# Patient Record
Sex: Male | Born: 1980 | Race: Black or African American | Hispanic: No | Marital: Single
Health system: Southern US, Community
[De-identification: ages and names within clinical notes are randomized; demographics above are authoritative.]

---
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
## 2003-07-27 ENCOUNTER — Emergency Department (HOSPITAL_COMMUNITY): Admission: EM | Admit: 2003-07-27 | Discharge: 2003-07-28 | Payer: Self-pay | Admitting: Emergency Medicine

## 2010-10-12 ENCOUNTER — Emergency Department (HOSPITAL_COMMUNITY): Payer: Self-pay

## 2010-10-12 ENCOUNTER — Emergency Department (HOSPITAL_COMMUNITY)
Admission: EM | Admit: 2010-10-12 | Discharge: 2010-10-12 | Disposition: A | Discharge status: Home / Self Care | Payer: Self-pay | Attending: Emergency Medicine | Admitting: Emergency Medicine

## 2010-10-12 DIAGNOSIS — M5380 Other specified dorsopathies, site unspecified: Secondary | ICD-10-CM | POA: Insufficient documentation

## 2010-10-12 DIAGNOSIS — R51 Headache: Secondary | ICD-10-CM | POA: Insufficient documentation

## 2010-10-12 DIAGNOSIS — J329 Chronic sinusitis, unspecified: Secondary | ICD-10-CM | POA: Insufficient documentation

## 2010-10-12 DIAGNOSIS — M542 Cervicalgia: Secondary | ICD-10-CM | POA: Insufficient documentation

## 2012-02-13 IMAGING — CT CT HEAD W/O CM
3 series · 17 of 30 positions shown, 19 images · non-contrast
Comparison: None.
COMPARISON: none

CLINICAL DATA: Headache

CT HEAD WITHOUT CONTRAST
TECHNIQUE: Contiguous axial images were obtained from the base of
the skull through the vertex without intravenous contrast.
CLINICAL DATA: Headache, right-sided pain
CT LIMITED SINUSES WITHOUT CONTRAST
TECHNIQUE: Multidetector CT images of the paranasal sinuses were
obtained in a single plane without contrast.

[Series 2: brain · axial · 0.48mm/px · z∈[+126,+228]mm · 7 of 28 slices shown, 9 images]
[im 4/28  brain]
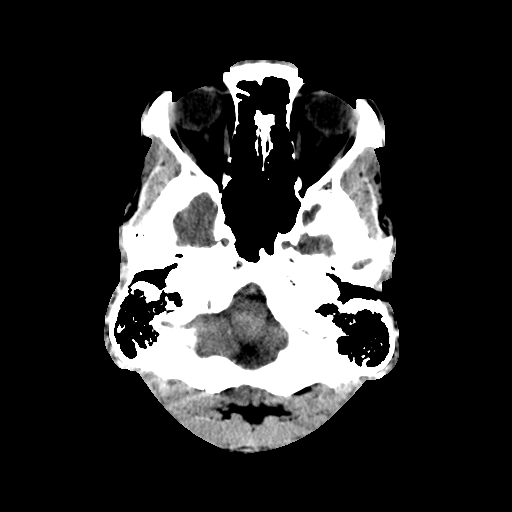
[im 4/28  bone]
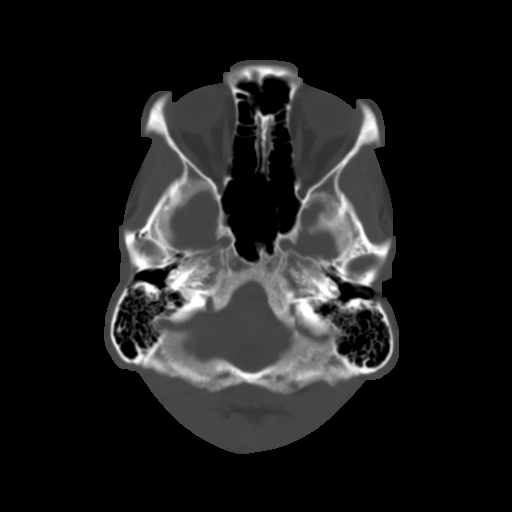
[im 7/28  brain]
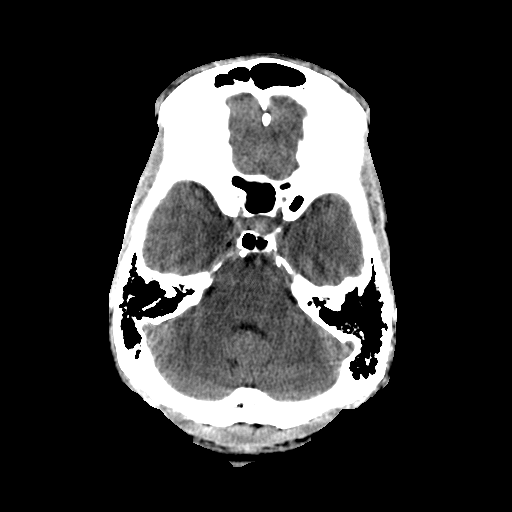
[im 11/28  brain]
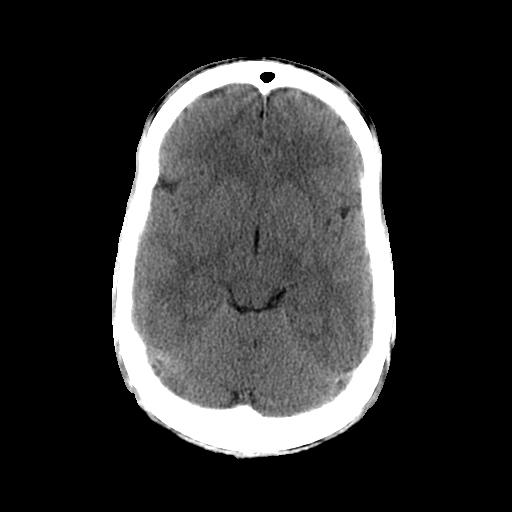
[im 14/28  brain]
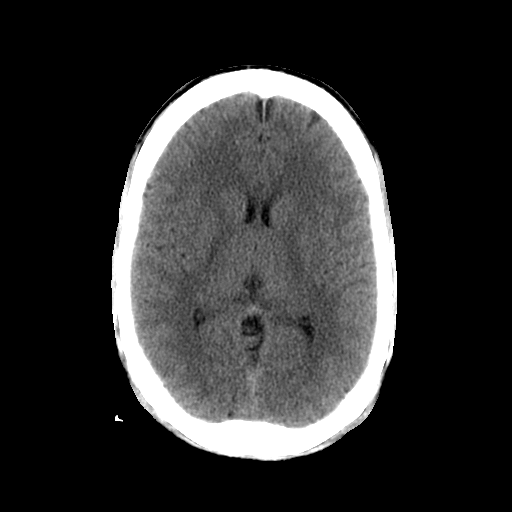
[im 17/28  brain]
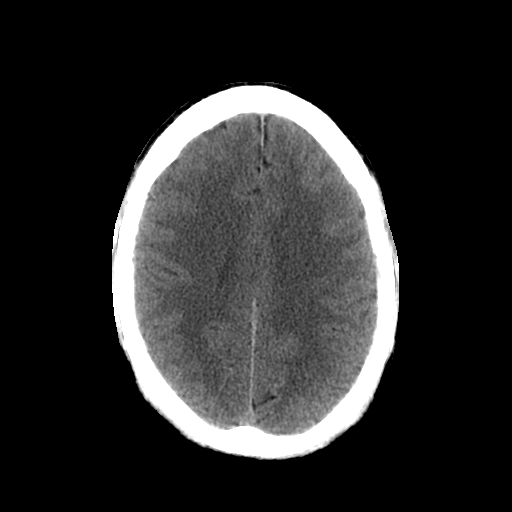
[im 17/28  bone]
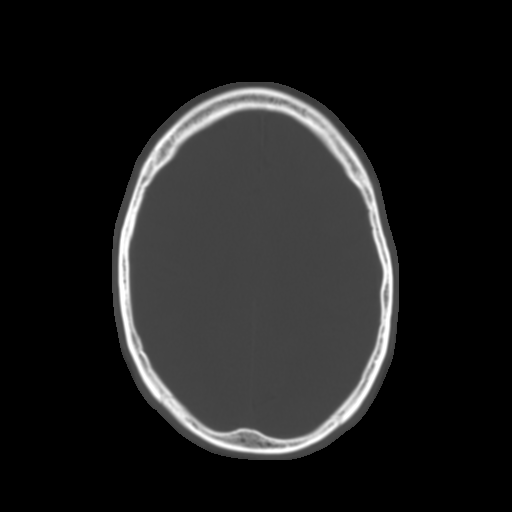
[im 21/28  brain]
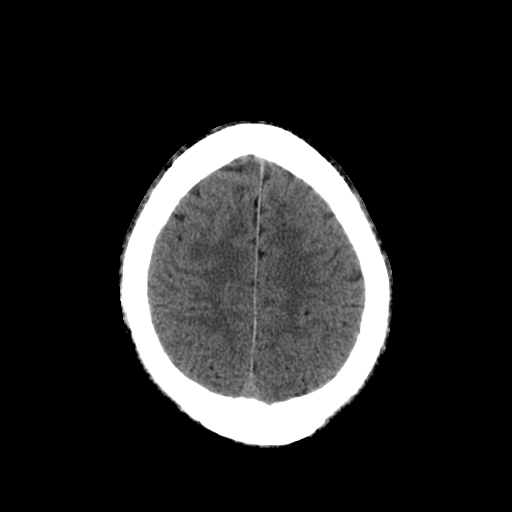
[im 24/28  brain]
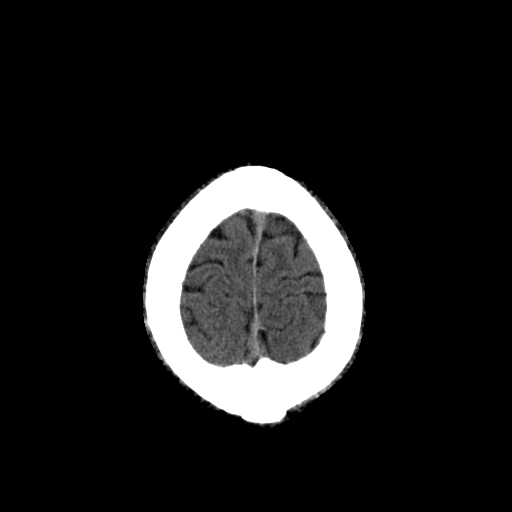

[Series 3: recon 2: brain · axial · 0.48mm/px · z∈[+126,+228]mm · 7 of 28 slices shown]
[im 4/28  brain]
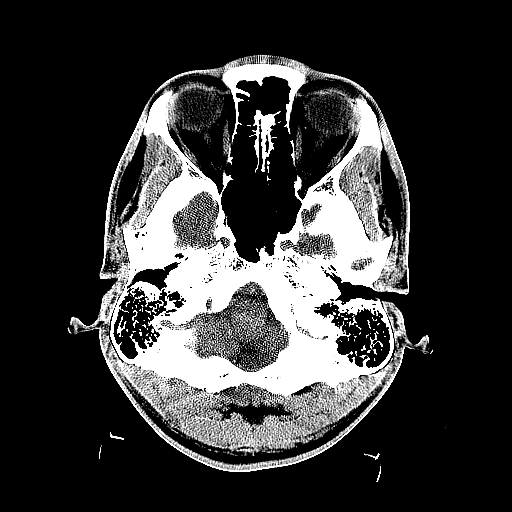
[im 7/28  brain]
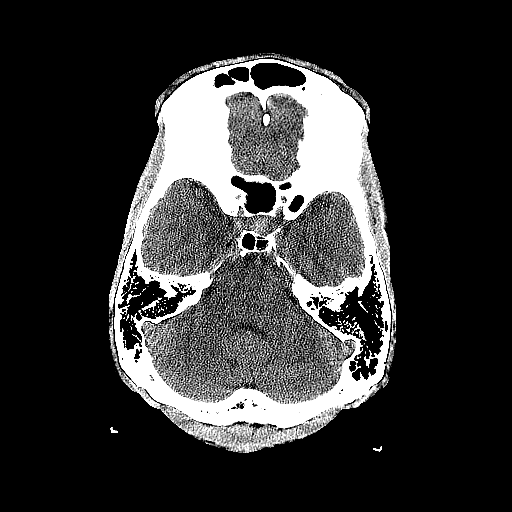
[im 11/28  brain]
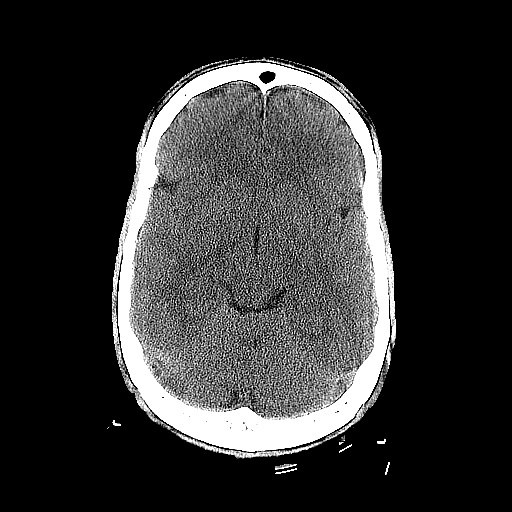
[im 14/28  brain]
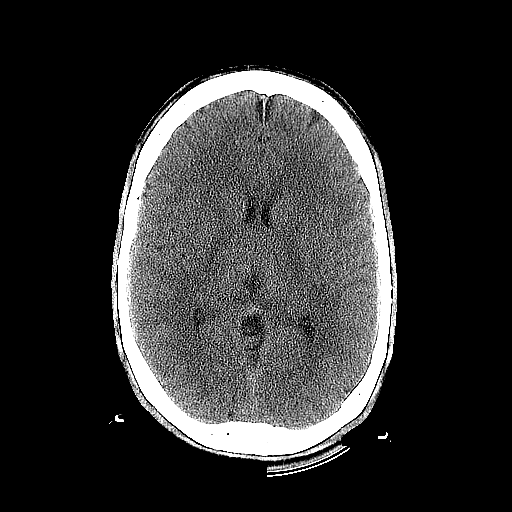
[im 17/28  brain]
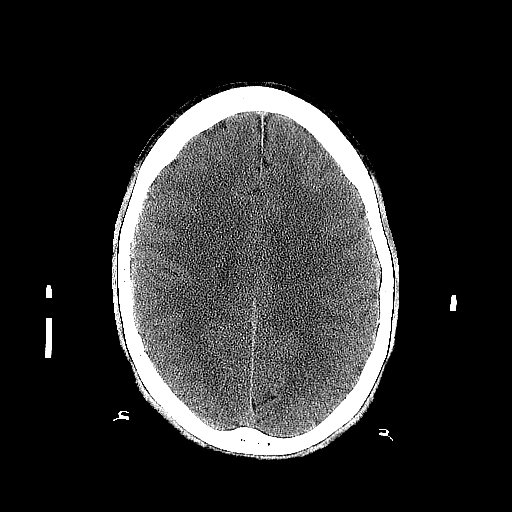
[im 21/28  brain]
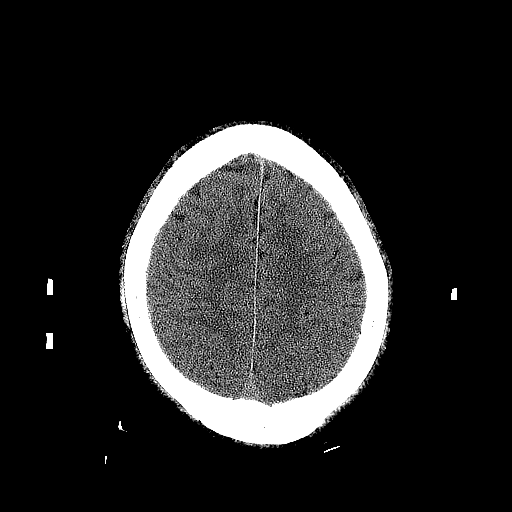
[im 24/28  brain]
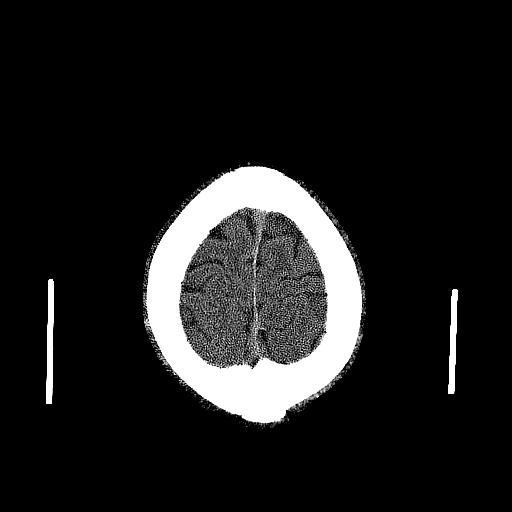

[Series 104: — · axial · 0.33mm/px · z∈[+65,+95]mm · 3 of 40 slices shown]
[im 4/40  brain]
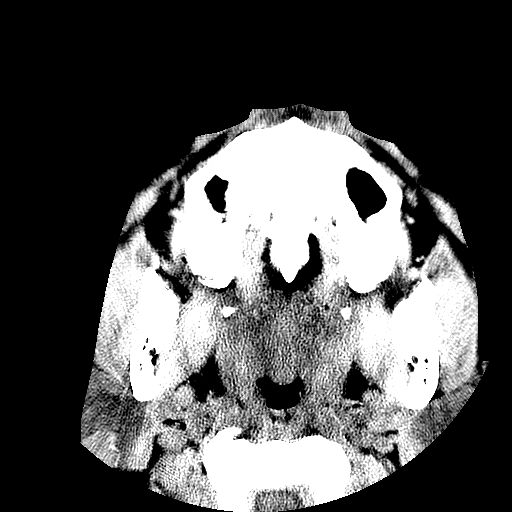
[im 8/40  brain]
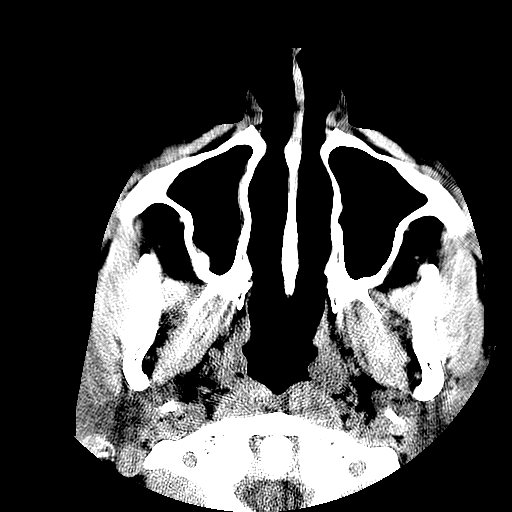
[im 15/40  brain]
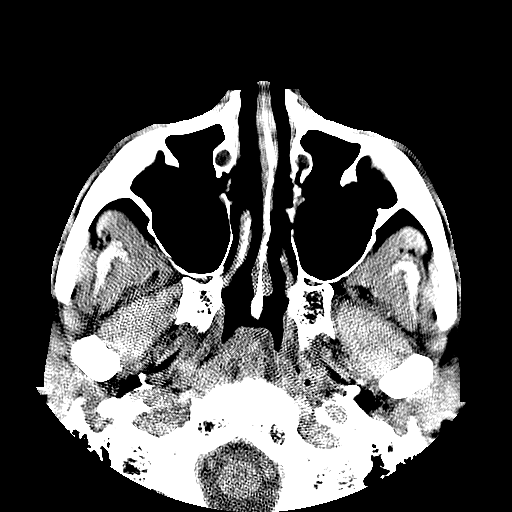

[17 of 30 positions shown; findings below may reference images not displayed]

FINDINGS: Ventricle size is normal.  Negative for infarct or mass.
Negative for hemorrhage.  Calvarium is intact.  Visualized sinuses
are clear.
IMPRESSION: Normal
FINDINGS: Minimal mucosal edema in the maxillary sinuses
bilaterally.  Remainder of the paranasal sinuses are clear.  No air-
fluid level.  No acute bony changes.
IMPRESSION: Mild mucosal edema and bilateral maxillary sinuses compatible with
chronic sinusitis.

## 2018-01-06 ENCOUNTER — Emergency Department (HOSPITAL_COMMUNITY)
Admission: EM | Admit: 2018-01-06 | Discharge: 2018-01-06 | Disposition: A | Discharge status: Home / Self Care | Payer: Self-pay | Attending: Emergency Medicine | Admitting: Emergency Medicine

## 2018-01-06 ENCOUNTER — Encounter (HOSPITAL_COMMUNITY): Payer: Self-pay | Admitting: Emergency Medicine

## 2018-01-06 ENCOUNTER — Other Ambulatory Visit: Payer: Self-pay

## 2018-01-06 DIAGNOSIS — K0889 Other specified disorders of teeth and supporting structures: Secondary | ICD-10-CM | POA: Insufficient documentation

## 2018-01-06 DIAGNOSIS — H6121 Impacted cerumen, right ear: Secondary | ICD-10-CM | POA: Insufficient documentation

## 2018-01-06 MED ORDER — NAPROXEN 500 MG PO TABS: 500.0000 mg | ORAL_TABLET | Freq: Two times a day (BID) | ORAL | 0 refills | Status: AC

## 2018-01-06 MED ORDER — PENICILLIN V POTASSIUM 500 MG PO TABS: 500.0000 mg | ORAL_TABLET | Freq: Four times a day (QID) | ORAL | 0 refills | Status: AC

## 2018-01-06 NOTE — ED Triage Notes (Signed)
Patient c/o right sided dental pain and right sided ear and facial  pain.

## 2018-01-06 NOTE — ED Notes (Signed)
Patient given discharge instructions and verbalized understanding.  Patient stable to discharge at this time.  Patient is alert and oriented to baseline.  No distressed noted at this time.  All belongings taken with the patient at discharge.   

## 2018-01-06 NOTE — ED Provider Notes (Signed)
[facility] EMERGENCY DEPARTMENT Provider Note   CSN: [id number] Arrival date & time: 01/06/18  4098     History   Chief Complaint Chief Complaint  Patient presents with  . Dental Pain    HPI [facility] Rodriguez is a 37 y.o. male.  HPI Patient presents to the emergency room for evaluation of right jaw pain that started about 30 days ago.  Patient states he has had some intermittent pain in his right upper jaw.  Patient has noted he has pain in his posterior molar and it hurts to chew.  More recently he started having pain in his ear as well.  Patient states the symptoms were increasing so he came to the emergency room to be evaluated.  He denies any fevers or chills.  No difficulty swallowing or breathing.  No sore throat.  No recent trauma. History reviewed. No pertinent past medical history.  There are no active problems to display for this patient.   History reviewed. No pertinent surgical history.      Home Medications    Prior to Admission medications   Medication Sig Start Date End Date Taking? Authorizing Provider  naproxen (NAPROSYN) 500 MG tablet Take 1 tablet (500 mg total) by mouth 2 (two) times daily with a meal. As needed for pain 01/06/18   Linwood Dibbles, MD  penicillin v potassium (VEETID) 500 MG tablet Take 1 tablet (500 mg total) by mouth 4 (four) times daily for 7 days. 01/06/18 01/13/18  Linwood Dibbles, MD    Family History No family history on file.  Social History Social History   Tobacco Use  . Smoking status: Not on file  Substance Use Topics  . Alcohol use: Not on file  . Drug use: Not on file     Allergies   Patient has no known allergies.   Review of Systems Review of Systems  All other systems reviewed and are negative.    Physical Exam Updated Vital Signs BP (!) 155/87 (BP Location: Right Arm)   Pulse 71   Temp 98.9 F (37.2 C) (Oral)   Resp 18   Ht 1.93 m (6\' 4" )   Wt 79.4 kg (175 lb)   SpO2 100%   BMI 21.30  kg/m   Physical Exam  Constitutional: He appears well-developed and well-nourished. No distress.  HENT:  Head: Normocephalic and atraumatic.  Right Ear: External ear normal.  Left Ear: External ear normal.  Cerumen impaction right ear, dental caries with obviously decayed teeth in the posterior molars in both the upper and lower dentition, no purulent drainage, no facial swelling  Eyes: Conjunctivae are normal. Right eye exhibits no discharge. Left eye exhibits no discharge. No scleral icterus.  Neck: Neck supple. No tracheal deviation present.  No submandibular or cervical adenopathy  Cardiovascular: Normal rate.  Pulmonary/Chest: Effort normal. No stridor. No respiratory distress.  Abdominal: He exhibits no distension.  Musculoskeletal: He exhibits no edema.  Lymphadenopathy:    He has no cervical adenopathy.  Neurological: He is alert. Cranial nerve deficit: no gross deficits.  Skin: Skin is warm and dry. No rash noted.  Psychiatric: He has a normal mood and affect.  Nursing note and vitals reviewed.    ED Treatments / Results  Labs (all labs ordered are listed, but only abnormal results are displayed) Labs Reviewed - No data to display  EKG None  Radiology No results found.  Procedures Procedures (including critical care time) Right ear irrigated by nursing staff. Medications  Ordered in ED Medications - No data to display   Initial Impression / Assessment and Plan / ED Course  I have reviewed the triage vital signs and the nursing notes.  Pertinent labs & imaging results that were available during my care of the patient were reviewed by me and considered in my medical decision making (see chart for details).  Clinical Course as of Jan 07 1655  Sun Jan 06, 2018  0932 Pt was reexamined after his ear was irrigated.  The earwax was removed.  No evidence of otitis media.     [JK]    Clinical Course User Index [JK] Linwood DibblesKnapp, Mana Morison, MD    Patient symptoms are most  likely related to his dental caries.  He did have her cerumen impaction but I do not think this is causing his jaw pain.  Patient was started on antibiotics and anti-inflammatories.  Recommended follow-up with a dentist  Final Clinical Impressions(s) / ED Diagnoses   Final diagnoses:  Toothache  Impacted cerumen of right ear    ED Discharge Orders        Ordered    penicillin v potassium (VEETID) 500 MG tablet  4 times daily     01/06/18 0930    naproxen (NAPROSYN) 500 MG tablet  2 times daily with meals     01/06/18 0930       Linwood DibblesKnapp, Sie Formisano, MD 01/06/18 1656

## 2018-01-06 NOTE — Discharge Instructions (Signed)
Take the medications as prescribed, follow up with a dentist as soon as you are able °

## 2022-04-13 ENCOUNTER — Emergency Department (HOSPITAL_COMMUNITY): Payer: Self-pay

## 2022-04-13 ENCOUNTER — Encounter (HOSPITAL_COMMUNITY): Payer: Self-pay | Admitting: Emergency Medicine

## 2022-04-13 ENCOUNTER — Emergency Department (HOSPITAL_COMMUNITY)
Admission: EM | Admit: 2022-04-13 | Discharge: 2022-04-14 | Disposition: A | Discharge status: Home / Self Care | Payer: Self-pay | Attending: Student | Admitting: Student

## 2022-04-13 DIAGNOSIS — W19XXXA Unspecified fall, initial encounter: Secondary | ICD-10-CM | POA: Insufficient documentation

## 2022-04-13 DIAGNOSIS — M25512 Pain in left shoulder: Secondary | ICD-10-CM

## 2022-04-13 DIAGNOSIS — Y92310 Basketball court as the place of occurrence of the external cause: Secondary | ICD-10-CM | POA: Insufficient documentation

## 2022-04-13 DIAGNOSIS — Y9367 Activity, basketball: Secondary | ICD-10-CM | POA: Insufficient documentation

## 2022-04-13 DIAGNOSIS — S4992XA Unspecified injury of left shoulder and upper arm, initial encounter: Secondary | ICD-10-CM | POA: Insufficient documentation

## 2022-04-13 MED ORDER — IBUPROFEN 800 MG PO TABS
800.0000 mg | ORAL_TABLET | Freq: Once | ORAL | Status: AC
  Administered 2022-04-14: 800 mg via ORAL
  Filled 2022-04-13: qty 1

## 2022-04-13 NOTE — ED Triage Notes (Signed)
Pt reports left shoulder injury while playing basketball today.

## 2022-04-13 NOTE — ED Provider Notes (Signed)
[facility] EMERGENCY DE[facility]MENT Provider Note   CSN: [id number] Arrival date & time: 04/13/22  1746     History  Chief Complaint  Patient presents with   Shoulder Injury    Russell Rodriguez is a 41 y.o. male.  HPI 41 year old male with no significant medical history presents to the ER with complaints of  left shoulder pain.  Patient states he was playing basketball and felt a strange pop or pull.  He denies falling or getting hit.  He is having pain with ranging it.  Denies any numbness or tingling.  Has taken some aspirin for pain with little relief.    Home Medications Prior to Admission medications   Medication Sig Start Date End Date Taking? Authorizing Provider  naproxen (NAPROSYN) 500 MG tablet Take 1 tablet (500 mg total) by mouth 2 (two) times daily with a meal. As needed for pain 01/06/18   Linwood Dibbles, MD      Allergies    Patient has no known allergies.    Review of Systems   Review of Systems Ten systems reviewed and are negative for acute change, except as noted in the HPI.   Physical Exam Updated Vital Signs BP 119/83 (BP Location: Left Arm)   Pulse 78   Temp 99.2 F (37.3 C)   Resp 18   Ht 6\' 4"  (1.93 m)   Wt 80 kg   SpO2 97%   BMI 21.47 kg/m  Physical Exam Vitals and nursing note reviewed.  Constitutional:      General: He is not in acute distress.    Appearance: He is well-developed.  HENT:     Head: Normocephalic and atraumatic.  Eyes:     Conjunctiva/sclera: Conjunctivae normal.  Cardiovascular:     Rate and Rhythm: Normal rate and regular rhythm.     Heart sounds: No murmur heard. Pulmonary:     Effort: Pulmonary effort is normal. No respiratory distress.     Breath sounds: Normal breath sounds.  Abdominal:     Palpations: Abdomen is soft.     Tenderness: There is no abdominal tenderness.  Musculoskeletal:        General: Tenderness present. No swelling.     Cervical back: Neck supple.     Comments: Left shoulder  with no deformity, though pain with range of motion and unable to lift above midline actively.  Able to raise with passive range of motion.  2+ radial pulses.  Sensations intact.  5/5 grip strength.  Skin:    General: Skin is warm and dry.     Capillary Refill: Capillary refill takes less than 2 seconds.  Neurological:     Mental Status: He is alert.  Psychiatric:        Mood and Affect: Mood normal.     ED Results / Procedures / Treatments   Labs (all labs ordered are listed, but only abnormal results are displayed) Labs Reviewed - No data to display  EKG None  Radiology CT Shoulder Left Wo Contrast  Result Date: 04/13/2022 CLINICAL DATA:  Shoulder trauma, instability or dislocation. Patient states he was playing basketball and fell strange pop or pole. EXAM: CT OF THE UPPER LEFT EXTREMITY WITHOUT CONTRAST TECHNIQUE: Multidetector CT imaging of the upper left extremity was performed according to the standard protocol. RADIATION DOSE REDUCTION: This exam was performed according to the departmental dose-optimization program which includes automated exposure control, adjustment of the mA and/or kV according to patient size and/or use of  iterative reconstruction technique. COMPARISON:  None Available. FINDINGS: Bones/Joint/Cartilage No fracture or dislocation. Normal alignment. No joint effusion. There is a bone island in the anterior aspect of the glenoid. Glenohumeral and acromioclavicular joints are unremarkable. Ligaments Ligaments are suboptimally evaluated by CT. Muscles and Tendons Rotator cuff muscles and deltoid are normal in bulk. No intramuscular hematoma or fluid collection. Soft tissue No fluid collection or hematoma. No soft tissue mass. Partially imaged left lung is clear. IMPRESSION: 1. No fracture or dislocation. 2. No joint effusion. 3. Muscles are normal without evidence of edema, hematoma or atrophy. Electronically Signed   By: Keane Police D.O.   On: 04/13/2022 23:22   DG  Shoulder Left  Result Date: 04/13/2022 CLINICAL DATA:  Left shoulder pain. Patient reports shoulder injury playing basketball. Unable to lift left arm. EXAM: LEFT SHOULDER - 2+ VIEW COMPARISON:  None Available. FINDINGS: No evidence of acute fracture. The humeral head is slightly anteriorly positioned on the transscapular Y-view, unclear if this is true subluxation or related to positioning. The acromioclavicular joint is congruent. There are probable bone islands in the glenoid. No soft tissue calcifications are seen. IMPRESSION: Slight anterior positioning of the humeral head on the transscapular Y-view, unclear if this is true subluxation or related to positioning. No evidence of acute fracture. Given patient is unable to lift left arm for axillary view, consider CT for further assessment. Electronically Signed   By: Keith Rake M.D.   On: 04/13/2022 19:43    Procedures Procedures    Medications Ordered in ED Medications  ibuprofen (ADVIL) tablet 800 mg (has no administration in time range)    ED Course/ Medical Decision Making/ A&P                           Medical Decision Making 41 year old male presenting with complaints of left shoulder pain after playing basketball.  No visible deformities on my exam and he is neurovascularly intact.  X-ray of the left shoulder reviewed, agree with radiology read  Slight anterior positioning of the humeral head on the transscapular  Y-view, unclear if this is true subluxation or related to  positioning. No evidence of acute fracture. Given patient is unable  to lift left arm for axillary view, consider CT for further  assessment.    CT of the left shoulder ordered, reviewed, agree with radiology read  IMPRESSION:  1. No fracture or dislocation.  2. No joint effusion.  3. Muscles are normal without evidence of edema, hematoma or  atrophy.   Overall reassuring.  Patient was given ibuprofen and a sling for comfort.  Will refer to  orthopedics.  Encouraged conservative treatment occluding ibuprofen, icing, Voltaren gel.  We discussed return precautions.  He voiced understanding and is agreeable.  Stable for discharge.  Final Clinical Impression(s) / ED Diagnoses Final diagnoses:  Acute pain of left shoulder    Rx / DC Orders ED Discharge Orders     None         Lyndel Safe 04/14/22 0004    Quintella Reichert, MD 04/14/22 [phone]

## 2022-04-13 NOTE — ED Provider Triage Note (Signed)
Emergency Medicine Provider Triage Evaluation Note  Russell Rodriguez , a 41 y.o. male  was evaluated in triage.  Pt complains of left shoulder pain.  Patient states he was playing basketball and felt a strange pop or pull.  He denies falling or getting hit.  Review of Systems  Positive: As above Negative: As above  Physical Exam  BP 126/84 (BP Location: Right Arm)   Pulse 95   Temp 99.1 F (37.3 C) (Oral)   Resp 16   SpO2 99%  Gen:   Awake, no distress   Resp:  Normal effort  MSK:   Patient holding his left arm in a protected position Other:    Medical Decision Making  Medically screening exam initiated at 6:29 PM.  Appropriate orders placed.  [facility] was informed that the remainder of the evaluation will be completed by another provider, this initial triage assessment does not replace that evaluation, and the importance of remaining in the ED until their evaluation is complete.     Dorothyann Peng, PA-C 04/13/22 1834

## 2022-04-14 NOTE — Discharge Instructions (Addendum)
Your scans today were reassuring however you may require an MRI if you continue have pain.  Please take 800 mg of ibuprofen every 8 hours as needed for pain.  May also use topical Voltaren gel which is an anti-inflammatory that can be found at your local pharmacy.  He may also apply ice or heat depending on what helps more.  Use a sling as needed for pain.  Please call Dr. Kathaleen Bury with orthopedics to schedule an appointment.  Return to the ER for any new or worsening symptoms.  See the handout for some exercises for your shoulder to help rehab it.

## 2024-03-07 ENCOUNTER — Emergency Department (HOSPITAL_COMMUNITY): Payer: Self-pay

## 2024-03-07 ENCOUNTER — Emergency Department (HOSPITAL_COMMUNITY)
Admission: EM | Admit: 2024-03-07 | Discharge: 2024-03-07 | Discharge status: Against Medical Advice | Payer: Self-pay | Attending: Emergency Medicine | Admitting: Emergency Medicine

## 2024-03-07 ENCOUNTER — Encounter (HOSPITAL_COMMUNITY): Payer: Self-pay | Admitting: *Deleted

## 2024-03-07 ENCOUNTER — Other Ambulatory Visit: Payer: Self-pay

## 2024-03-07 DIAGNOSIS — Z5321 Procedure and treatment not carried out due to patient leaving prior to being seen by health care provider: Secondary | ICD-10-CM | POA: Insufficient documentation

## 2024-03-07 DIAGNOSIS — M25551 Pain in right hip: Secondary | ICD-10-CM | POA: Insufficient documentation

## 2024-03-07 NOTE — ED Notes (Signed)
 Pt called times 3 no answer

## 2024-03-07 NOTE — ED Triage Notes (Signed)
 Pt was running a baton race, felt a pop in his right hip . Ambulatory with mild discomfort. Hx of issues with same hip

## 2024-04-17 ENCOUNTER — Encounter: Payer: Self-pay | Admitting: *Deleted

## 2024-04-17 NOTE — Congregational Nurse Program (Signed)
  Dept: [phone]   Congregational Nurse Program Note  Date of Encounter: 04/17/2024  Past Medical History: No past medical history on file.  Encounter Details:  Community Questionnaire - 04/17/24 [phone]       Questionnaire   Ask client: Do you give verbal consent for me to treat you today? Yes    Student Assistance N/A    Location Patient Served  [facility]    Encounter Setting CN site;Phone/Text/Email    Population Status Unhoused    Insurance Unknown    Insurance/Financial Assistance Referral N/A   client reports in process of getting medicaid   Medication N/A    Medical Provider No    Screening Referrals Made N/A    Medical Referrals Made Health Department    Medical Appointment Completed N/A    CNP Interventions Advocate/Support;Navigate Healthcare System;Case Management    Screenings CN Performed Blood Pressure    ED Visit Averted N/A    Life-Saving Intervention Made N/A         Client signed up for [facility] requesting assistance with labs and establishing PCP. Client reports he is in process of getting insurance and does not have any at this time. Writer assisted Dr. Kathrin in [facility] clinic. Referred to [facility] Dept. Called [place] location and left message with client's phone number to call back to establish PCP and assist with labs. Blood pressure (!) 142/72, pulse 83, SpO2 96%.  Zahmir Lalla W RN CN
